# Patient Record
Sex: Female | Born: 1965 | Hispanic: No | Marital: Married | Smoking: Former smoker
Health system: Southern US, Community
[De-identification: ages and names within clinical notes are randomized; demographics above are authoritative.]

## PROBLEM LIST (undated history)

## (undated) DIAGNOSIS — N83201 Unspecified ovarian cyst, right side: Secondary | ICD-10-CM

## (undated) DIAGNOSIS — N83202 Unspecified ovarian cyst, left side: Secondary | ICD-10-CM

## (undated) DIAGNOSIS — F3281 Premenstrual dysphoric disorder: Secondary | ICD-10-CM

## (undated) HISTORY — DX: Premenstrual dysphoric disorder: F32.81

## (undated) HISTORY — DX: Unspecified ovarian cyst, right side: N83.201

## (undated) HISTORY — PX: LAPAROSCOPY ABDOMEN DIAGNOSTIC: PRO50

## (undated) HISTORY — DX: Unspecified ovarian cyst, left side: N83.202

---

## 1997-09-25 HISTORY — PX: LEFT OOPHORECTOMY: SHX1961

## 2005-07-26 ENCOUNTER — Other Ambulatory Visit: Admission: RE | Admit: 2005-07-26 | Discharge: 2005-07-26 | Payer: Self-pay | Admitting: Obstetrics and Gynecology

## 2007-04-16 ENCOUNTER — Encounter: Admission: RE | Admit: 2007-04-16 | Discharge: 2007-04-16 | Payer: Self-pay | Admitting: *Deleted

## 2008-03-24 ENCOUNTER — Ambulatory Visit: Payer: Self-pay | Admitting: Family Medicine

## 2008-03-24 DIAGNOSIS — R002 Palpitations: Secondary | ICD-10-CM | POA: Insufficient documentation

## 2008-03-24 DIAGNOSIS — R6882 Decreased libido: Secondary | ICD-10-CM | POA: Insufficient documentation

## 2008-03-24 DIAGNOSIS — N943 Premenstrual tension syndrome: Secondary | ICD-10-CM | POA: Insufficient documentation

## 2008-03-24 DIAGNOSIS — N83209 Unspecified ovarian cyst, unspecified side: Secondary | ICD-10-CM | POA: Insufficient documentation

## 2008-03-25 ENCOUNTER — Encounter: Payer: Self-pay | Admitting: Family Medicine

## 2008-03-27 ENCOUNTER — Telehealth (INDEPENDENT_AMBULATORY_CARE_PROVIDER_SITE_OTHER): Payer: Self-pay | Admitting: *Deleted

## 2008-03-27 ENCOUNTER — Encounter: Payer: Self-pay | Admitting: Family Medicine

## 2008-03-27 LAB — CONVERTED CEMR LAB
ALT: 26 units/L (ref 0–35)
AST: 21 units/L (ref 0–37)
Albumin: 4.2 g/dL (ref 3.5–5.2)
BUN: 15 mg/dL (ref 6–23)
CO2: 24 meq/L (ref 19–32)
Calcium: 9 mg/dL (ref 8.4–10.5)
Chloride: 109 meq/L (ref 96–112)
Cholesterol: 183 mg/dL (ref 0–200)
Creatinine, Ser: 0.81 mg/dL (ref 0.40–1.20)
FSH: 13.6 milliintl units/mL
HCT: 38.3 % (ref 36.0–46.0)
HDL: 83 mg/dL (ref >39)
Hemoglobin: 12.2 g/dL (ref 12.0–15.0)
Potassium: 4.4 meq/L (ref 3.5–5.3)
TSH: 2.825 microintl units/mL (ref 0.350–4.50)
Testosterone: 31.13 ng/dL (ref 10–70)
Total CHOL/HDL Ratio: 2.2
WBC: 5 10*3/uL (ref 4.0–10.5)

## 2008-03-30 ENCOUNTER — Encounter: Admission: RE | Admit: 2008-03-30 | Discharge: 2008-03-30 | Payer: Self-pay | Admitting: Family Medicine

## 2008-03-30 LAB — CONVERTED CEMR LAB
Folate: >20 ng/mL
Vitamin B-12: 328 pg/mL (ref 211–911)

## 2008-10-16 ENCOUNTER — Telehealth (INDEPENDENT_AMBULATORY_CARE_PROVIDER_SITE_OTHER): Payer: Self-pay | Admitting: *Deleted

## 2009-03-02 ENCOUNTER — Other Ambulatory Visit: Admission: RE | Admit: 2009-03-02 | Discharge: 2009-03-02 | Payer: Self-pay | Admitting: Obstetrics and Gynecology

## 2009-11-26 ENCOUNTER — Encounter: Payer: Self-pay | Admitting: Family Medicine

## 2010-01-03 IMAGING — US US TRANSVAGINAL NON-OB
1 series · 14 of 25 positions shown · non-contrast
Comparison: None

CLINICAL DATA: Ovarian cystic mass.

TRANSABDOMINAL AND TRANSVAGINAL ULTRASOUND OF PELVIS
TECHNIQUE: Both transabdominal and transvaginal ultrasound
examinations of the pelvis were performed including evaluation of
the uterus, ovaries, adnexal regions, and pelvic cul-de-sac.

[Series 1: us transvaginal non-ob · 0.32mm/px · 47 acquisitions, 14 frames shown]
[im 1/47]
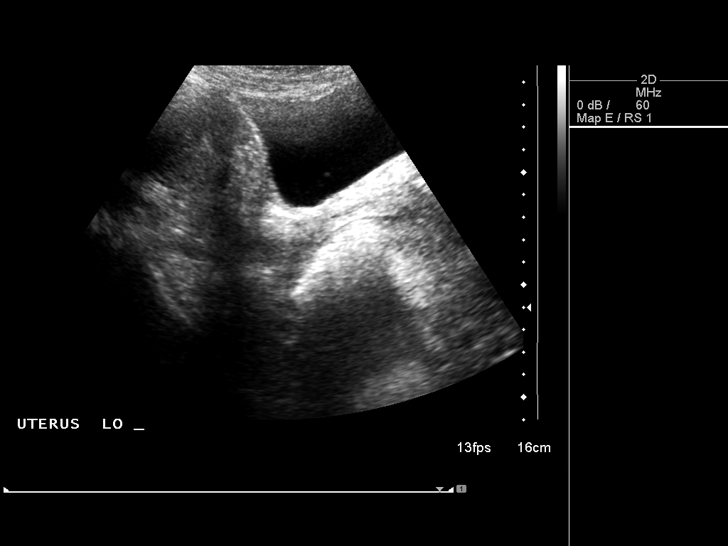
[im 4/47]
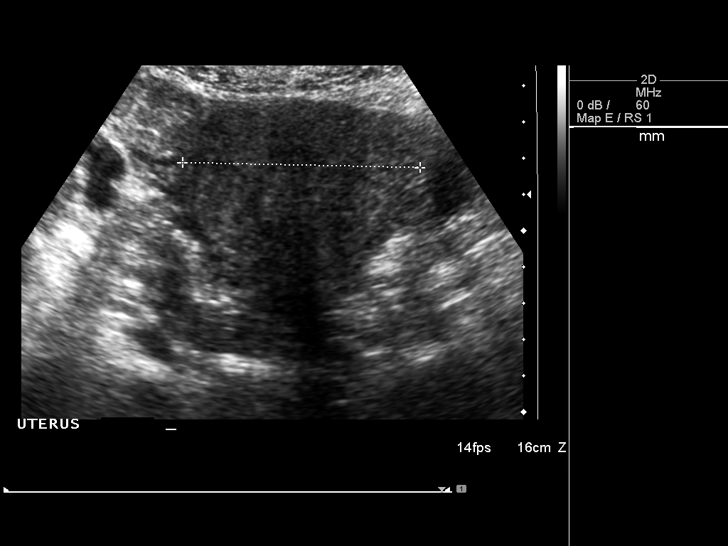
[im 8/47]
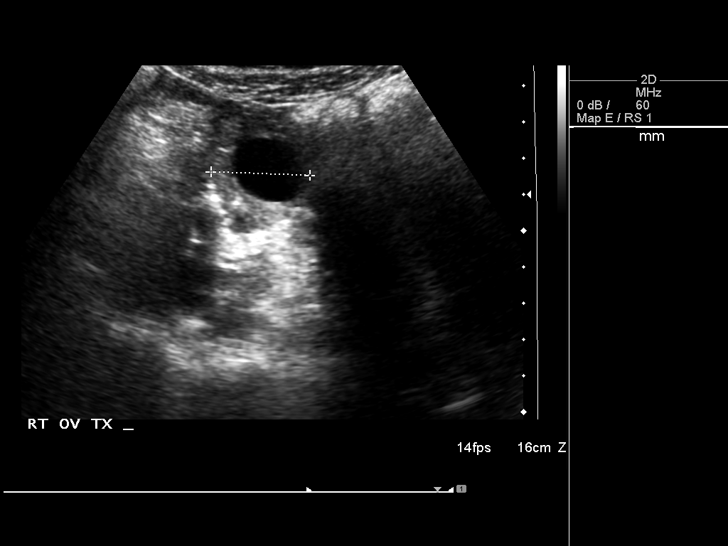
[im 12/47]
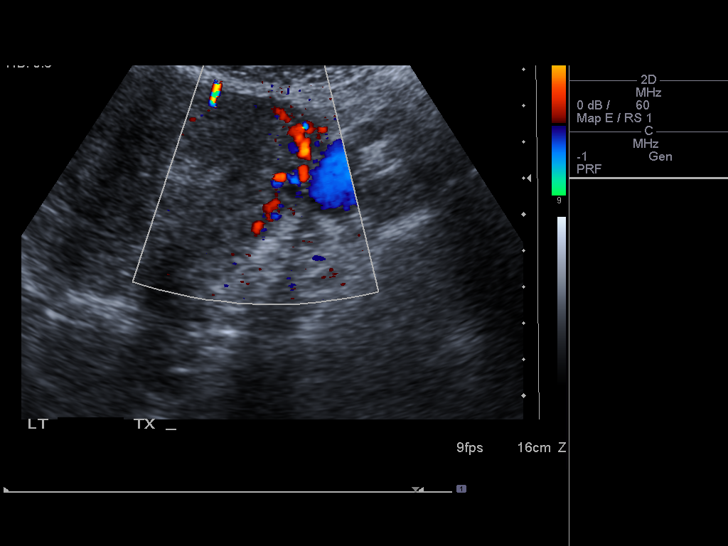
[im 16/47]
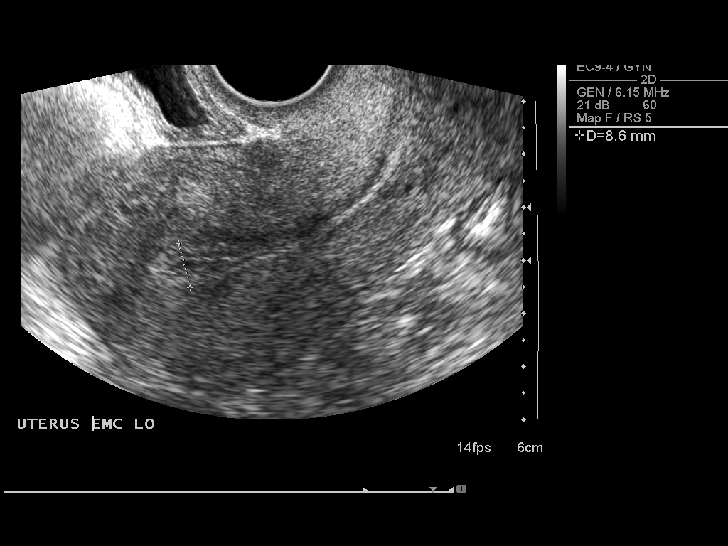
[im 18/47]
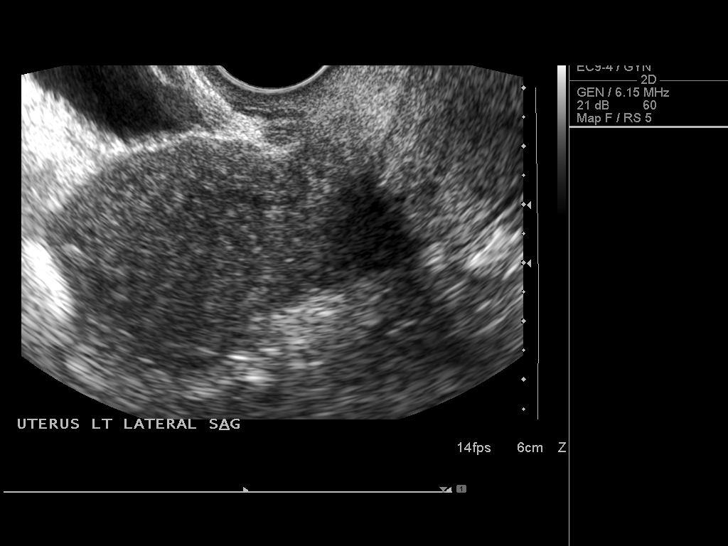
[im 22/47]
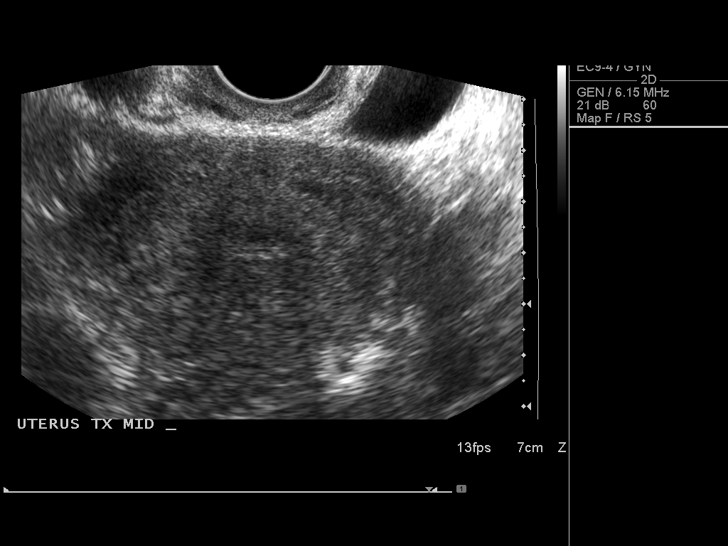
[im 25/47]
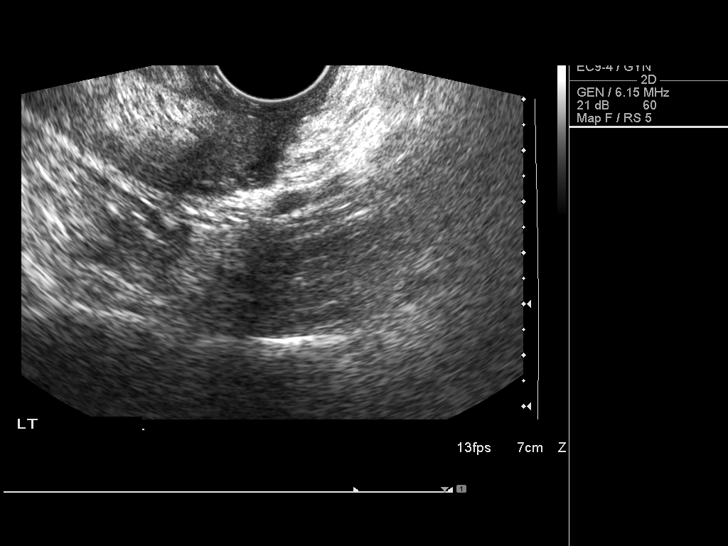
[im 29/47]
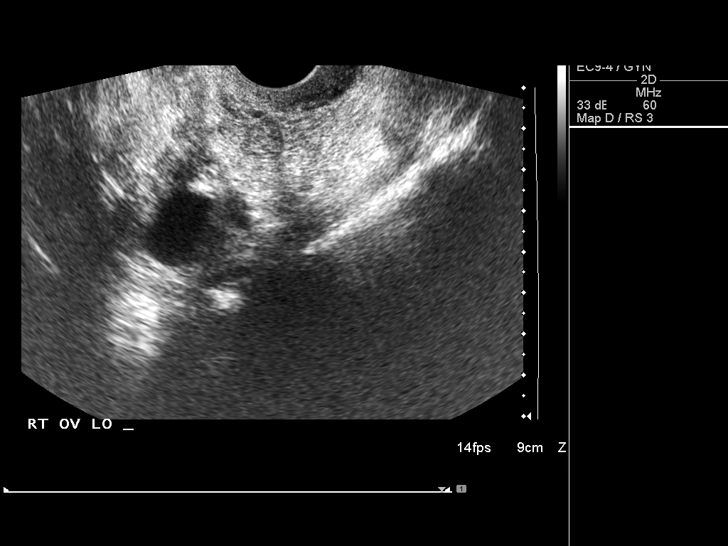
[im 31/47]
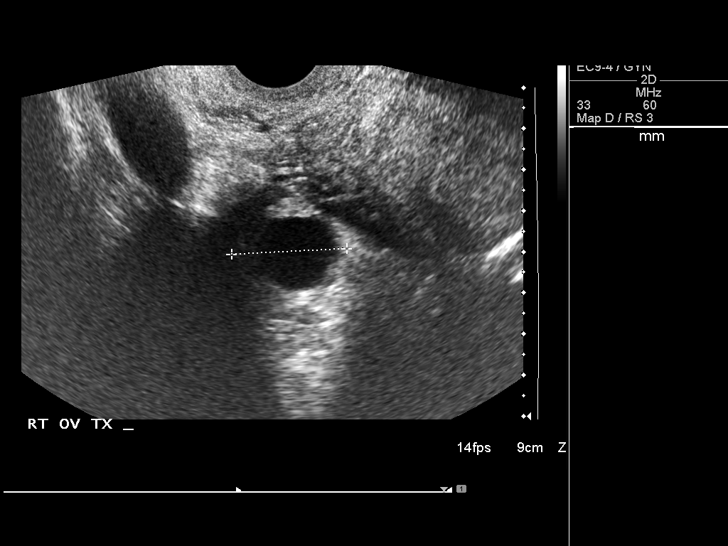
[im 35/47]
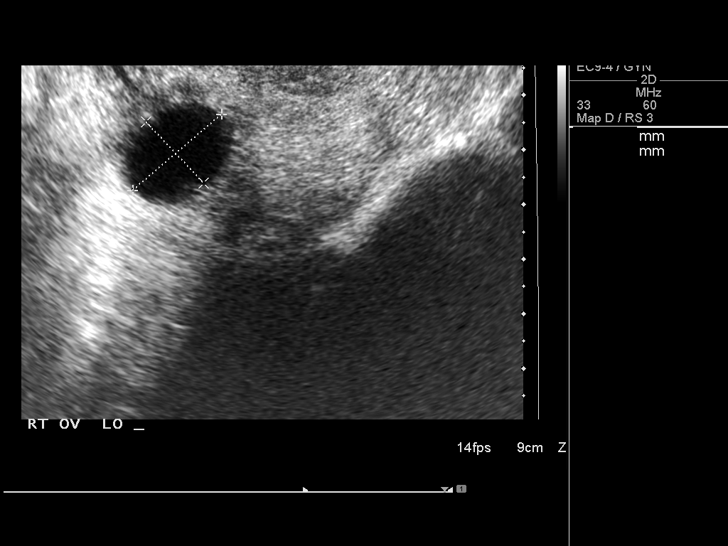
[im 39/47]
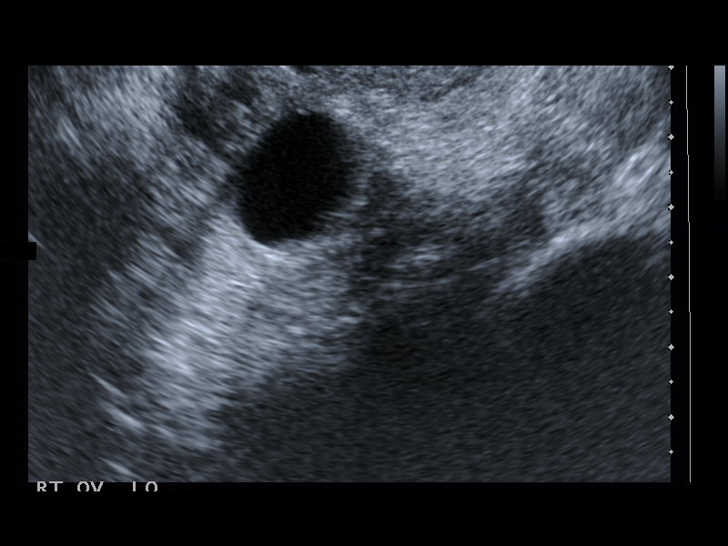
[im 43/47]
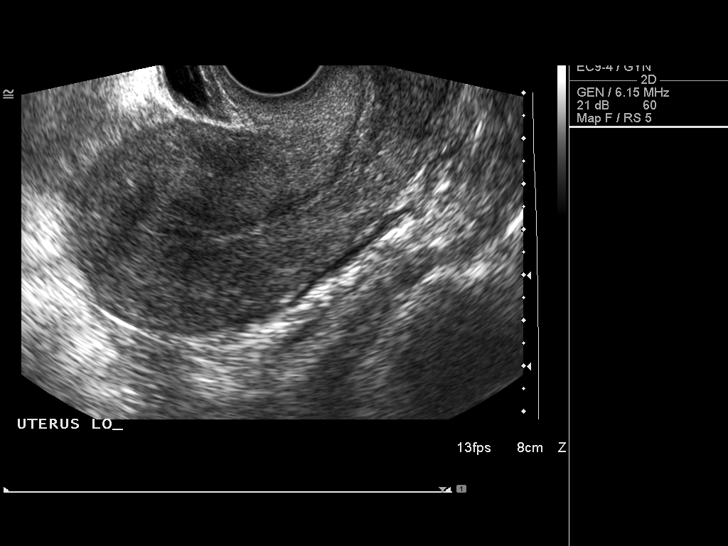
[im 47/47]
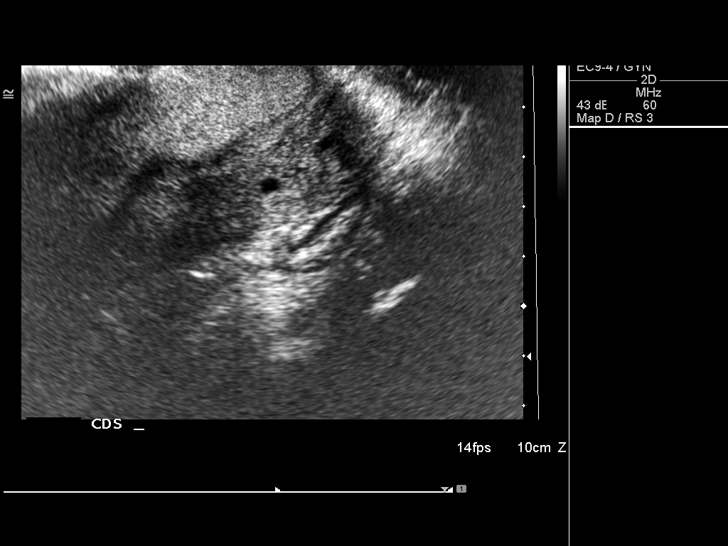

[14 of 25 positions shown; findings below may reference images not displayed]

FINDINGS: Uterus measures 9.5 x 4.3 x 6.6 cm.  Endometrial stripe
measures 9 mm.  Right ovary measures 3.0 x 2.3 x 2.8 cm and
contains an anechoic lesion with increased through transmission and
internal septation, measuring 2.1 x 1.5 x 1.9 cm.  Left ovary is
absent.  No free fluid.
IMPRESSION: 1.  Minimally complex left ovarian cyst.  This can be followed in 6
- 10 weeks to ensure resolution, as clinically indicated.

## 2010-10-25 NOTE — Letter (Signed)
Summary: [facility]  [facility]   Imported By: Lanelle Bal 12/14/2009 14:19:12  _____________________________________________________________________  External Attachment:    Type:   Image     Comment:   External Document

## 2010-11-09 ENCOUNTER — Telehealth: Payer: Self-pay | Admitting: Family Medicine

## 2010-11-16 NOTE — Progress Notes (Signed)
Summary: mammogram referral -   Phone Note Call from Patient   Caller: Patient Summary of Call: patient is here in the office with her daughter and would like a order to get her mammogram  completed.Michaelle Copas  November 09, 2010 3:34 PM  Initial call taken by: Michaelle Copas,  November 09, 2010 3:34 PM

## 2011-05-02 ENCOUNTER — Telehealth: Payer: Self-pay | Admitting: Family Medicine

## 2011-05-02 ENCOUNTER — Ambulatory Visit
Admission: RE | Admit: 2011-05-02 | Discharge: 2011-05-02 | Disposition: A | Discharge status: Home / Self Care | Payer: Self-pay | Source: Ambulatory Visit | Attending: Family Medicine | Admitting: Family Medicine

## 2011-05-02 DIAGNOSIS — Z1231 Encounter for screening mammogram for malignant neoplasm of breast: Secondary | ICD-10-CM

## 2011-05-02 NOTE — Telephone Encounter (Signed)
Mammogram order added.

## 2011-10-13 ENCOUNTER — Encounter: Payer: BC Managed Care – PPO | Admitting: Family Medicine

## 2011-10-20 ENCOUNTER — Ambulatory Visit (INDEPENDENT_AMBULATORY_CARE_PROVIDER_SITE_OTHER): Payer: BC Managed Care – PPO | Admitting: Family Medicine

## 2011-10-20 ENCOUNTER — Encounter: Payer: Self-pay | Admitting: Family Medicine

## 2011-10-20 VITALS — BP 97/58 | HR 58 | Ht 66.0 in | Wt 164.0 lb

## 2011-10-20 DIAGNOSIS — Z0000 Encounter for general adult medical examination without abnormal findings: Secondary | ICD-10-CM

## 2011-10-20 DIAGNOSIS — Z23 Encounter for immunization: Secondary | ICD-10-CM

## 2011-10-20 DIAGNOSIS — R1011 Right upper quadrant pain: Secondary | ICD-10-CM

## 2011-10-20 NOTE — Patient Instructions (Addendum)
Start a regular exercise program and make sure you are eating a healthy diet Try to eat 4 servings of dairy a day or take a calcium supplement (500mg twice a day). Your vaccines are up to date.  We will call you with your lab results. If you don't here from us in about a week then please give us a call at [phone].  

## 2011-10-20 NOTE — Progress Notes (Signed)
Subjective:     Judith Hatfield is a 46 y.o. female and is here for a comprehensive physical exam. The patient reports problems - Some abdominal pain. She c/o of mostly RUQ pain but is occ in the RLQ and in the left side of her abdomen. Can happen anytime. No known triggers. Pain is mild and last a few mintues. No nauseat or chagne in bowels. no fever or urinary sxs. Saw gyn about 1.5 months ago and they felt it was not a gyn issues. No GERD or excess gas. Marland Kitchen  History   Social History  . Marital Status: Married    Spouse Name: N/A    Number of Children: 3  . Years of Education: HS   Occupational History  . Not on file.   Social History Main Topics  . Smoking status: Former Smoker    Types: Cigarettes    Quit date: 09/25/2009  . Smokeless tobacco: Not on file  . Alcohol Use: 2.0 - 2.5 oz/week    4-5 drink(s) per week  . Drug Use: No  . Sexually Active: Not on file   Other Topics Concern  . Not on file   Social History Narrative   Works out regularly.   Health Maintenance  Topic Date Due  . Influenza Vaccine  06/25/2012  . Pap Smear  07/26/2014  . Tetanus/tdap  10/19/2021    The following portions of the patient's history were reviewed and updated as appropriate: allergies, current medications, past family history, past medical history, past social history, past surgical history and problem list.  Review of Systems A comprehensive review of systems was negative.   Objective:    BP 97/58  Pulse 58  Ht 5\' 6"  (1.676 m)  Wt 164 lb (74.39 kg)  BMI 26.47 kg/m2 General appearance: alert, cooperative and appears stated age Head: Normocephalic, without obvious abnormality, atraumatic Eyes: conj clear, EOMi, PEERLA Ears: normal TM's and external ear canals both ears Nose: Nares normal. Septum midline. Mucosa normal. No drainage or sinus tenderness. Throat: lips, mucosa, and tongue normal; teeth and gums normal Neck: no adenopathy, no carotid bruit, no JVD, supple,  symmetrical, trachea midline and thyroid not enlarged, symmetric, no tenderness/mass/nodules Back: symmetric, no curvature. ROM normal. No CVA tenderness. Lungs: clear to auscultation bilaterally Breasts: normal appearance, no masses or tenderness Heart: regular rate and rhythm, S1, S2 normal, no murmur, click, rub or gallop Abdomen: soft, non-tender; bowel sounds normal; no masses,  no organomegaly Extremities: extremities normal, atraumatic, no cyanosis or edema Pulses: 2+ and symmetric Skin: Skin color, texture, turgor normal. No rashes or lesions Lymph nodes: Cervical, supraclavicular, and axillary nodes normal. Neurologic: Alert and oriented X 3, normal strength and tone. Normal symmetric reflexes. Normal coordination and gait    Assessment:    Healthy female exam.      Plan:     See After Visit Summary for Counseling Recommendations  Start a regular exercise program and make sure you are eating a healthy diet Try to eat 4 servings of dairy a day or take a calcium supplement (500mg  twice a day). Your vaccines are up to date.   Abdominal pain - unclear etiology-I suspect it may be related to her bowels and the pain does seem to move. He typically only last for minutes. Other alleviating factors. I will go ahead and check a CMP today to make sure liver enzymes are normal since most of her pain is concentrated in the right upper quadrant area. He had a  pelvic exam and this has been ruled out. Also check a CBC to rule out infection that she's not having any fever reflux nausea or change in bowels. I also recommended she consider a probiotic such as a line to see if this makes a difference if her labs are normal.

## 2011-10-24 LAB — CBC WITH DIFFERENTIAL/PLATELET
Basophils Absolute: 0 10*3/uL (ref 0.0–0.1)
Basophils Relative: 1 % (ref 0–1)
Eosinophils Absolute: 0.1 10*3/uL (ref 0.0–0.7)
Hemoglobin: 13.1 g/dL (ref 12.0–15.0)
MCH: 33.7 pg (ref 26.0–34.0)
MCHC: 33.1 g/dL (ref 30.0–36.0)
Monocytes Relative: 8 % (ref 3–12)
Neutro Abs: 4.1 10*3/uL (ref 1.7–7.7)
Neutrophils Relative %: 62 % (ref 43–77)
Platelets: 159 10*3/uL (ref 150–400)
RDW: 13.6 % (ref 11.5–15.5)

## 2011-10-24 LAB — COMPLETE METABOLIC PANEL WITH GFR
ALT: 8 U/L (ref 0–35)
Albumin: 4.2 g/dL (ref 3.5–5.2)
CO2: 29 mEq/L (ref 19–32)
Calcium: 9 mg/dL (ref 8.4–10.5)
Chloride: 105 mEq/L (ref 96–112)
GFR, Est African American: >89 mL/min
GFR, Est Non African American: >89 mL/min
Glucose, Bld: 97 mg/dL (ref 70–99)
Potassium: 4.5 mEq/L (ref 3.5–5.3)
Sodium: 140 mEq/L (ref 135–145)
Total Protein: 6.3 g/dL (ref 6.0–8.3)

## 2011-10-24 LAB — LIPID PANEL
HDL: 71 mg/dL (ref >39)
LDL Cholesterol: 85 mg/dL (ref 0–99)

## 2011-10-24 NOTE — Progress Notes (Signed)
Quick Note:  All labs are normal. ______ 

## 2012-03-12 ENCOUNTER — Ambulatory Visit (INDEPENDENT_AMBULATORY_CARE_PROVIDER_SITE_OTHER): Payer: BC Managed Care – PPO | Admitting: Family Medicine

## 2012-03-12 VITALS — BP 109/67 | HR 64

## 2012-03-12 DIAGNOSIS — Z23 Encounter for immunization: Secondary | ICD-10-CM

## 2012-03-12 NOTE — Progress Notes (Signed)
  Subjective:    Patient ID: Judith Hatfield, female    DOB: October 22, 1965, 46 y.o.   MRN: [id number] Shingles vaccine HPI    Review of Systems     Objective:   Physical Exam        Assessment & Plan:

## 2012-10-30 ENCOUNTER — Telehealth: Payer: Self-pay | Admitting: Family Medicine

## 2012-10-30 ENCOUNTER — Ambulatory Visit (INDEPENDENT_AMBULATORY_CARE_PROVIDER_SITE_OTHER): Payer: BC Managed Care – PPO | Admitting: Family Medicine

## 2012-10-30 ENCOUNTER — Other Ambulatory Visit: Payer: Self-pay | Admitting: Family Medicine

## 2012-10-30 ENCOUNTER — Encounter: Payer: Self-pay | Admitting: Family Medicine

## 2012-10-30 VITALS — BP 114/65 | HR 61 | Ht 66.0 in | Wt 159.0 lb

## 2012-10-30 DIAGNOSIS — M25569 Pain in unspecified knee: Secondary | ICD-10-CM

## 2012-10-30 DIAGNOSIS — R1011 Right upper quadrant pain: Secondary | ICD-10-CM

## 2012-10-30 DIAGNOSIS — M2550 Pain in unspecified joint: Secondary | ICD-10-CM

## 2012-10-30 DIAGNOSIS — M7710 Lateral epicondylitis, unspecified elbow: Secondary | ICD-10-CM

## 2012-10-30 NOTE — Patient Instructions (Addendum)
We will call you with your lab results. If you don't here from us in about a week then please give us a call at [phone].  

## 2012-10-30 NOTE — Telephone Encounter (Signed)
Call patient: Ultrasound and HIDA scan were performed with her GI doctor a year ago. The results were normal as she had expressed. At this point I think the next step would be a CAT scan or CT of the abdomen. Will place order and we can schedule.

## 2012-10-30 NOTE — Telephone Encounter (Signed)
I will put in order

## 2012-10-30 NOTE — Telephone Encounter (Signed)
Pt called and informed.Judith Hatfield  

## 2012-10-30 NOTE — Progress Notes (Signed)
Subjective:    Patient ID: Judith Hatfield, female    DOB: 12/20/1965, 47 y.o.   MRN: [id number]  HPI Having right sided abdominal pain, like a squezzing pressure.  Says happens dialy.  Mostly in the RUQ. NO known triggers or alleviating factors.  Says has been going on for oever a year since going on.  Did start a probiotic but not helping.  Has some liver disease in her family.  Last only minuets.  Position doesn't matter.  No nausea.  No blood in the stool. No vomiting.  No bulging in that area.  Had HIDA scan and CT or US done with GI group in [place] at  [address] about a year ago.   Having knee and elbow pain and hip pain,  as well.  Hx of tennis elbow.  Left knee is worse. Pain is mostly medial "over the muscle" but also having pain below the knee cap on the left esp worse with  Going up stairs.  She is right handed.  Has already seen ortho for her elbows and was told tennis elbow. No injections or PT She has been wearing her strap, resting it, icing it. She is already doing her home exercises.  She does have some stiffness in her hands in the morning but otherwise denies any significant pain or swelling of the hand joints.   Review of Systems BP 114/65  Pulse 61  Ht 5\' 6"  (1.676 m)  Wt 159 lb (72.122 kg)  BMI 25.66 kg/m2    No Known Allergies  Past Medical History  Diagnosis Date  . PMDD (premenstrual dysphoric disorder)   . Bilateral ovarian cysts     Past Surgical History  Procedure Date  . Laparoscopy abdomen diagnostic 1992, 1998    Ovarian cysts   . Left oophorectomy 1999    benign tumor    History   Social History  . Marital Status: Married    Spouse Name: N/A    Number of Children: 3  . Years of Education: HS   Occupational History  . Not on file.   Social History Main Topics  . Smoking status: Former Smoker    Types: Cigarettes    Quit date: 09/25/2009  . Smokeless tobacco: Not on file  . Alcohol Use: 2.0 - 2.5 oz/week    4-5 drink(s) per  week  . Drug Use: No  . Sexually Active: Not on file   Other Topics Concern  . Not on file   Social History Narrative   Works out regularly.    Family History  Problem Relation Age of Onset  . Endometriosis Mother   . Heart disease Father 41  . Hypertension Father     No outpatient encounter prescriptions on file as of 10/30/2012.          Objective:   Physical Exam  Constitutional: She is oriented to person, place, and time. She appears well-developed and well-nourished.  HENT:  Head: Normocephalic and atraumatic.  Cardiovascular: Normal rate, regular rhythm and normal heart sounds.   Pulmonary/Chest: Effort normal and breath sounds normal.  Abdominal: Soft. Bowel sounds are normal. She exhibits no distension and no mass. There is no tenderness. There is no rebound and no guarding.  Musculoskeletal: She exhibits no edema.       Knees with normal range of motion. No crepitus. No tenderness along the patella or of the joint lines. She points to the medial knee near the satarious muscle.  Neurological: She  is alert and oriented to person, place, and time.  Skin: Skin is warm and dry.  Psychiatric: She has a normal mood and affect. Her behavior is normal.          Assessment & Plan:  RUQ pain - I. do not have noticed from her additional workup that she had by GI in the computer system. We call to get a copy of those. I did review them. she has seen gyn and told pian i snot  gyn or pelvic related. She saw GI in FEb of last year she had a HIDA scan performed on 12/11/2011 that was normal. She also had an ultrasound of the liver performed on 11/30/2011 that was essentially normal. No gallstones or biliary dilatation. She was given a prescription for Levsin and told to followup if she was not feeling any improvement in her symptoms. Though they tried  to reassure her that it was not GI related.  Since his are all normal then we could consider CT for further evaluation.  Knee  pain-I. do think she may have an element of patellofemoral syndrome on the left knee. I think she may be having been over her satarious muscles as well. I discuss course of stretching, etc but she says she really was more concerned about her RUQ pain but was ok with additional labs to eval for autoimmune type arthritis.   Tennis elbow-discussed that if she is not improving with conservative therapy including a strap, icing, and inset then she may benefit from injection or more formal physical therapy. She will think about this.

## 2012-10-31 ENCOUNTER — Telehealth: Payer: Self-pay | Admitting: *Deleted

## 2012-10-31 ENCOUNTER — Ambulatory Visit: Payer: BC Managed Care – PPO | Admitting: Family Medicine

## 2012-10-31 LAB — RHEUMATOID FACTOR: Rhuematoid fact SerPl-aCnc: <10 IU/mL (ref <=14)

## 2012-10-31 LAB — CMP AND LIVER
AST: 16 U/L (ref 0–37)
BUN: 19 mg/dL (ref 6–23)
Bilirubin, Direct: 0.1 mg/dL (ref 0.0–0.3)
CO2: 26 mEq/L (ref 19–32)
Calcium: 9.7 mg/dL (ref 8.4–10.5)
Chloride: 106 mEq/L (ref 96–112)
Creat: 0.71 mg/dL (ref 0.50–1.10)
Total Bilirubin: 0.4 mg/dL (ref 0.3–1.2)

## 2012-10-31 LAB — CBC
HCT: 40.6 % (ref 36.0–46.0)
MCH: 32.9 pg (ref 26.0–34.0)
MCV: 96.9 fL (ref 78.0–100.0)
RDW: 13.5 % (ref 11.5–15.5)
WBC: 5.3 10*3/uL (ref 4.0–10.5)

## 2012-10-31 LAB — SEDIMENTATION RATE: Sed Rate: 1 mm/hr (ref 0–22)

## 2012-10-31 LAB — ESTIMATED GFR
GFR, Est African American: >89 mL/min
GFR, Est Non African American: >89 mL/min

## 2012-10-31 NOTE — Telephone Encounter (Signed)
Prior auth for CT Abdomen/Pelvis obtained per [employer]. Auth # [id number]

## 2012-11-01 ENCOUNTER — Telehealth: Payer: Self-pay | Admitting: Family Medicine

## 2012-11-01 NOTE — Telephone Encounter (Signed)
Call patient: All other labs look great. No sign of rheumatoid arthritis. Her blood counts normal. No anemia.

## 2012-11-04 NOTE — Telephone Encounter (Signed)
Patient advised.

## 2012-11-11 ENCOUNTER — Telehealth: Payer: Self-pay

## 2012-11-11 NOTE — Telephone Encounter (Signed)
She states she also needs CT of the chest because she has pain midline of her chest. Please advise.

## 2012-11-11 NOTE — Telephone Encounter (Signed)
I don't recommend CT of the chest. Needs EKG if having chest discomfort.

## 2012-11-12 NOTE — Telephone Encounter (Signed)
Pt.notified

## 2012-11-15 ENCOUNTER — Other Ambulatory Visit: Payer: Self-pay | Admitting: Family Medicine

## 2012-11-15 ENCOUNTER — Ambulatory Visit (INDEPENDENT_AMBULATORY_CARE_PROVIDER_SITE_OTHER): Payer: BC Managed Care – PPO

## 2012-11-15 DIAGNOSIS — J90 Pleural effusion, not elsewhere classified: Secondary | ICD-10-CM

## 2012-11-15 DIAGNOSIS — R1011 Right upper quadrant pain: Secondary | ICD-10-CM

## 2012-11-15 DIAGNOSIS — N83209 Unspecified ovarian cyst, unspecified side: Secondary | ICD-10-CM

## 2012-11-15 MED ORDER — IOHEXOL 300 MG/ML  SOLN
100.0000 mL | Freq: Once | INTRAMUSCULAR | Status: AC | PRN
  Administered 2012-11-15: 100 mL via INTRAVENOUS

## 2012-11-18 ENCOUNTER — Institutional Professional Consult (permissible substitution): Payer: BC Managed Care – PPO | Admitting: Internal Medicine

## 2012-11-22 ENCOUNTER — Institutional Professional Consult (permissible substitution): Payer: BC Managed Care – PPO | Admitting: Internal Medicine

## 2012-12-03 ENCOUNTER — Institutional Professional Consult (permissible substitution): Payer: BC Managed Care – PPO | Admitting: Pulmonary Disease

## 2021-01-06 ENCOUNTER — Other Ambulatory Visit: Payer: Self-pay

## 2021-01-06 ENCOUNTER — Ambulatory Visit (INDEPENDENT_AMBULATORY_CARE_PROVIDER_SITE_OTHER): Payer: BC Managed Care – PPO

## 2021-01-06 ENCOUNTER — Ambulatory Visit: Payer: Self-pay | Admitting: Podiatry

## 2021-01-06 ENCOUNTER — Encounter: Payer: Self-pay | Admitting: Podiatry

## 2021-01-06 DIAGNOSIS — M76821 Posterior tibial tendinitis, right leg: Secondary | ICD-10-CM

## 2021-01-06 DIAGNOSIS — M76822 Posterior tibial tendinitis, left leg: Secondary | ICD-10-CM | POA: Diagnosis not present

## 2021-01-06 DIAGNOSIS — M79671 Pain in right foot: Secondary | ICD-10-CM

## 2021-01-11 ENCOUNTER — Encounter: Payer: Self-pay | Admitting: Podiatry

## 2021-01-11 NOTE — Progress Notes (Signed)
Subjective:  Patient ID: Judith Hatfield, female    DOB: 12-24-1965,  MRN: [id number]  Chief Complaint  Patient presents with  . Foot Pain    Bilateral foot pain     55 y.o. female presents with the above complaint.  Patient presents with complaint of bilateral medial foot pain that has been going for quite some time.  Patient states it hurts when ambulating.  She is on her feet all day long.  It progressively continues to be painful when ambulating.  She has not seen anyone else prior to seeing me.  She states that nothing has really helped.  She has not made any shoe gear modification.  She denies any other acute complaints.  Pain scale is 7 out of 10 and is dull achy in nature.   Review of Systems: Negative except as noted in the HPI. Denies N/V/F/Ch.  Past Medical History:  Diagnosis Date  . Bilateral ovarian cysts   . PMDD (premenstrual dysphoric disorder)     Current Outpatient Medications:  .  traMADol (ULTRAM) 50 MG tablet, Take by mouth., Disp: , Rfl:  .  amoxicillin-clavulanate (AUGMENTIN) 875-125 MG tablet, Take 1 tablet by mouth 2 (two) times daily., Disp: , Rfl:  .  HYDROcodone-acetaminophen (NORCO/VICODIN) 5-325 MG tablet, Take by mouth., Disp: , Rfl:  .  microfibrillar collagen (AVITENE FLOUR) powder, Apply topically., Disp: , Rfl:  .  Multiple Vitamin (MULTI-VITAMIN) tablet, Take 1 tablet by mouth daily., Disp: , Rfl:   Social History   Tobacco Use  Smoking Status Former Smoker  . Types: Cigarettes  . Quit date: 09/25/2009  . Years since quitting: 11.3  Smokeless Tobacco Not on file    No Known Allergies Objective:  There were no vitals filed for this visit. There is no height or weight on file to calculate BMI. Constitutional Well developed. Well nourished.  Vascular Dorsalis pedis pulses palpable bilaterally. Posterior tibial pulses palpable bilaterally. Capillary refill normal to all digits.  No cyanosis or clubbing noted. Pedal hair growth normal.   Neurologic Normal speech. Oriented to person, place, and time. Epicritic sensation to light touch grossly present bilaterally.  Dermatologic Nails well groomed and normal in appearance. No open wounds. No skin lesions.  Orthopedic:  Pain along the course of posterior tibial tendon including the insertion.  Navicular exostosis clinically palpated.  Pain on palpation to the exostosis.  Pain with resisted plantarflexion inversion of the foot.  No pain with dorsiflexion eversion of the foot.  No deep intra-articular pain.  No pain at the peroneal tendon Achilles tendon ATFL ligament bilaterally   Radiographs: 3 views of skeletally mature bilateral foot: No fractures noted.  There is decreasing calcaneal inclination angle increasing talar declination angle midfoot arthrosis noted.  Mild first ray elevatus noted.  Navicular exostosis noted  Assessment:   1. Posterior tibial tendinitis of right leg   2. Posterior tibial tendinitis of left leg    Plan:  Patient was evaluated and treated and all questions answered.  Bilateral posterior tibial tendinitis with underlying navicular exostosis -I explained to the patient the etiology of tendinitis and various treatment options were extensively discussed.  Ultimately I discussed with her the reason why patient is having pain along the course of the tendon is likely due to the underlying pes planovalgus foot structure.  I discussed with her the importance of wearing good shoes as well as orthotics/insoles.  Given that her pain is only mild to moderate in nature she would like to do  the Tri-Lock ankle brace with possible steroid injection.  I discussed with her that there is a risk of rupture with a steroid injection.  She would like to proceed despite the risks. -A steroid injection was performed at Bilateral medial foot a point of maximal tenderness using 1% plain Lidocaine and 10 mg of Kenalog. This was well tolerated. -Bilateral Tri lock ankle brace were  dispensed   Return in about 3 months (around 04/07/2021).

## 2021-02-03 ENCOUNTER — Other Ambulatory Visit: Payer: Self-pay

## 2021-02-03 ENCOUNTER — Ambulatory Visit (INDEPENDENT_AMBULATORY_CARE_PROVIDER_SITE_OTHER): Payer: BC Managed Care – PPO | Admitting: *Deleted

## 2021-02-03 DIAGNOSIS — M76821 Posterior tibial tendinitis, right leg: Secondary | ICD-10-CM

## 2021-02-03 DIAGNOSIS — M76822 Posterior tibial tendinitis, left leg: Secondary | ICD-10-CM

## 2021-02-03 NOTE — Progress Notes (Signed)
Patient presents today to pick up custom molded foot orthotics, diagnosed with posterior tibial tendonitis by Dr. Allena Katz.   Orthotics were dispensed and fit was satisfactory. Reviewed instructions for break-in and wear. Written instructions given to patient.  Patient will follow up as needed.   [facility] - order # [id number]

## 2021-04-13 ENCOUNTER — Telehealth: Payer: Self-pay | Admitting: *Deleted

## 2021-04-13 NOTE — Telephone Encounter (Signed)
Patient called today and cancelled upcoming appointment.

## 2021-04-15 ENCOUNTER — Ambulatory Visit: Payer: BC Managed Care – PPO | Admitting: Podiatry
